# Patient Record
Sex: Male | Born: 1960
Health system: Southern US, Community
[De-identification: ages and names within clinical notes are randomized; demographics above are authoritative.]

---

## 2021-07-15 ENCOUNTER — Other Ambulatory Visit (HOSPITAL_COMMUNITY): Payer: Self-pay | Admitting: *Deleted

## 2021-07-15 DIAGNOSIS — I4729 Other ventricular tachycardia: Secondary | ICD-10-CM

## 2021-07-20 ENCOUNTER — Other Ambulatory Visit (HOSPITAL_COMMUNITY): Payer: Self-pay | Admitting: Cardiology

## 2021-07-20 DIAGNOSIS — I4729 Other ventricular tachycardia: Secondary | ICD-10-CM

## 2021-08-27 ENCOUNTER — Telehealth (HOSPITAL_COMMUNITY): Payer: Self-pay | Admitting: *Deleted

## 2021-08-27 NOTE — Telephone Encounter (Signed)
Attempted to call patient regarding upcoming cardiac MRI appointment. Left message on voicemail with name and callback number  Ginevra Tacker RN Navigator Cardiac Imaging [facility] [phone] Office [phone] Cell  

## 2021-08-30 ENCOUNTER — Telehealth (HOSPITAL_COMMUNITY): Payer: Self-pay | Admitting: *Deleted

## 2021-08-30 NOTE — Telephone Encounter (Signed)
Reaching out to patient to offer assistance regarding upcoming cardiac imaging study; pt verbalizes understanding of appt date/time, parking situation and where to check in, and verified current allergies; name and call back number provided for further questions should they arise  Celica Kotowski RN Navigator Cardiac Imaging [facility] [phone] office [phone] cell  Patient denies claustrophobia or metal. 

## 2021-08-31 ENCOUNTER — Ambulatory Visit (HOSPITAL_COMMUNITY)
Admission: RE | Admit: 2021-08-31 | Discharge: 2021-08-31 | Disposition: A | Discharge status: Home / Self Care | Payer: BC Managed Care – PPO | Source: Ambulatory Visit | Attending: Cardiology | Admitting: Cardiology

## 2021-08-31 ENCOUNTER — Other Ambulatory Visit: Payer: Self-pay

## 2021-08-31 DIAGNOSIS — I4729 Other ventricular tachycardia: Secondary | ICD-10-CM | POA: Insufficient documentation

## 2021-08-31 MED ORDER — GADOBUTROL 1 MMOL/ML IV SOLN
10.0000 mL | Freq: Once | INTRAVENOUS | Status: AC | PRN
  Administered 2021-08-31: 10 mL via INTRAVENOUS

## 2023-02-04 IMAGING — MR MR CARD MORPHOLOGY WO/W CM
45 of 48 series · 45 of 48 positions shown · IV contrast (Contrast agent)
Comparison: none

CLINICAL DATA: 60M with HTN p/w syncope. Echo with normal
biventricular function, no significant valvular disease. Coronary
CTA with nonobstructive CAD.

EXAM:
CARDIAC MRI
TECHNIQUE: The patient was scanned on a 1.5 Tesla Siemens magnet. A dedicated
cardiac coil was used. Functional imaging was done using [employer]iest[employer]
sequences. [DATE], and 4 chamber views were done to assess for RWMA's.
Modified Sienna rule using a short axis stack was used to
calculate an ejection fraction on a dedicated work station using
[employer] software. The patient received 10 cc of Gadavist. After 10
minutes inversion recovery sequences were used to assess for
infiltration and scar tissue.
CONTRAST:  10 cc  of Gadavist

[Series 4: [id number] · axial · 8.0mm · 1.48mm/px · 1 of 18 slices shown]
[im 1/18]
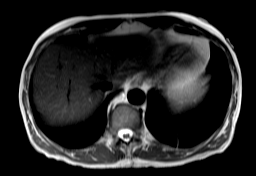

[Series 8: bSSFP · oblique · 8.0mm · 1.61mm/px · 1 of 25 slices shown (1 of 22)]
[im 1/25]
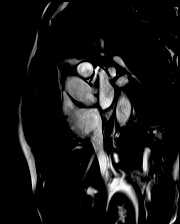

[Series 9: bSSFP · oblique · 8.0mm · 1.61mm/px · 1 of 25 slices shown (2 of 22)]
[im 1/25]
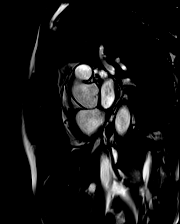

[Series 10: bSSFP · oblique · 8.0mm · 1.61mm/px · 1 of 25 slices shown (3 of 22)]
[im 1/25]
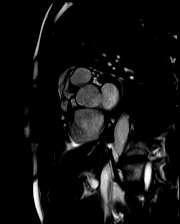

[Series 11: bSSFP · oblique · 8.0mm · 1.61mm/px · 1 of 25 slices shown (4 of 22)]
[im 1/25]
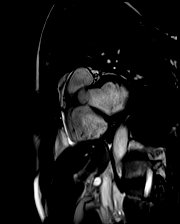

[Series 12: bSSFP · oblique · 8.0mm · 1.61mm/px · 1 of 25 slices shown (5 of 22)]
[im 1/25]
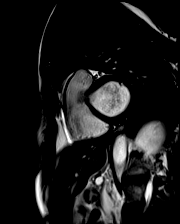

[Series 13: bSSFP · oblique · 8.0mm · 1.61mm/px · 1 of 25 slices shown (6 of 22)]
[im 1/25]
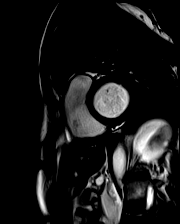

[Series 14: bSSFP · oblique · 8.0mm · 1.61mm/px · 1 of 25 slices shown (7 of 22)]
[im 1/25]
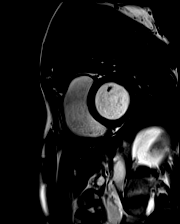

[Series 15: bSSFP · oblique · 8.0mm · 1.61mm/px · 1 of 25 slices shown (8 of 22)]
[im 1/25]
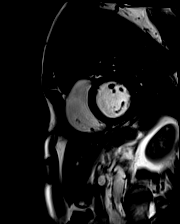

[Series 16: bSSFP · oblique · 8.0mm · 1.61mm/px · 1 of 25 slices shown (9 of 22)]
[im 1/25]
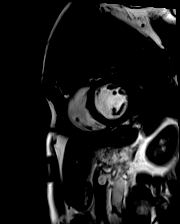

[Series 17: bSSFP · oblique · 8.0mm · 1.61mm/px · 1 of 25 slices shown (10 of 22)]
[im 1/25]
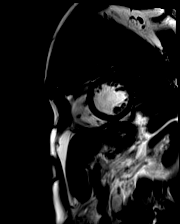

[Series 18: bSSFP · oblique · 8.0mm · 1.61mm/px · 1 of 25 slices shown (11 of 22)]
[im 1/25]
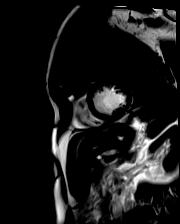

[Series 19: bSSFP · oblique · 8.0mm · 1.61mm/px · 1 of 25 slices shown (12 of 22)]
[im 1/25]
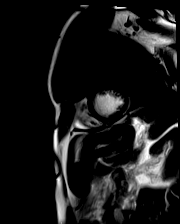

[Series 20: bSSFP · oblique · 8.0mm · 1.61mm/px · 1 of 25 slices shown (13 of 22)]
[im 1/25]
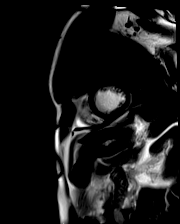

[Series 21: bSSFP · oblique · 8.0mm · 1.61mm/px · 1 of 25 slices shown (14 of 22)]
[im 1/25]
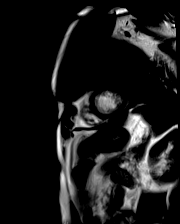

[Series 22: bSSFP · oblique · 8.0mm · 1.61mm/px · 1 of 25 slices shown (15 of 22)]
[im 1/25]
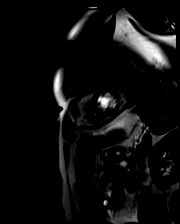

[Series 23: bSSFP · oblique · 8.0mm · 1.61mm/px · 1 of 25 slices shown (16 of 22)]
[im 1/25]
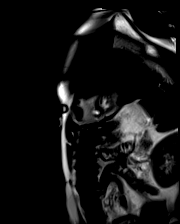

[Series 24: bSSFP · oblique · 8.0mm · 1.61mm/px · 1 of 25 slices shown (17 of 22)]
[im 1/25]
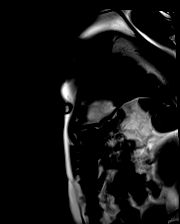

[Series 25: bSSFP · oblique · 8.0mm · 1.61mm/px · 1 of 25 slices shown (18 of 22)]
[im 1/25]
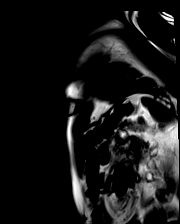

[Series 26: bSSFP · axial · 6.0mm · 1.41mm/px · 1 of 25 slices shown (19 of 22)]
[im 1/25]
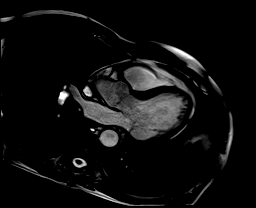

[Series 27: bSSFP · coronal · 6.0mm · 1.41mm/px · 1 of 25 slices shown (20 of 22)]
[im 1/25]
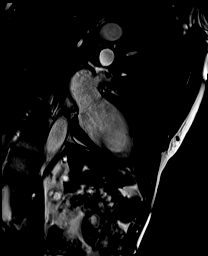

[Series 28: bSSFP · axial · 6.0mm · 1.41mm/px · 1 of 25 slices shown (21 of 22)]
[im 1/25]
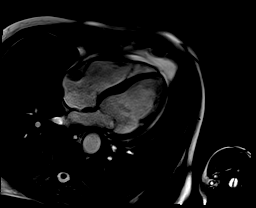

[Series 29: (id)_long_t1 · oblique · 8.0mm · 1.56mm/px · 1 of 24 slices shown]
[im 1/24]
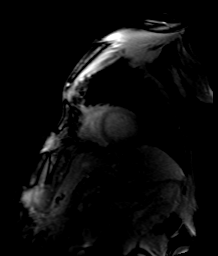

[Series 30: (id)_long_t1_moco · oblique · 8.0mm · 1.56mm/px · 1 of 24 slices shown]
[im 1/24]
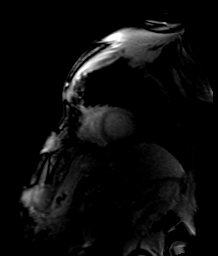

[Series 33: (id)_trufi · oblique · 8.0mm · 2.08mm/px · 1 of 9 slices shown]
[im 1/9]
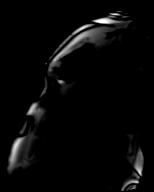

[Series 34: (id)_trufi_moco · oblique · 8.0mm · 2.08mm/px · 1 of 9 slices shown]
[im 1/9]
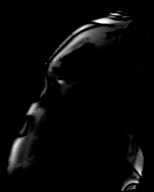

[Series 37: STIR · oblique · 8.0mm · 1.92mm/px · 1 of 17 slices shown]
[im 1/17]
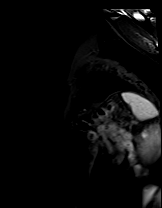

[Series 38: pre short axis · oblique · non-contrast · 8.0mm · 2.62mm/px · 1 of 10 slices shown (1 of 6)]
[im 1/10]
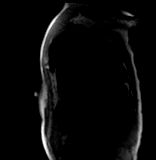

[Series 39: pre short axis · oblique · non-contrast · 8.0mm · 2.62mm/px · 1 of 10 slices shown (2 of 6)]
[im 1/10]
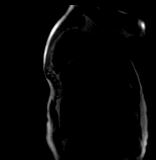

[Series 40: pre short axis · oblique · non-contrast · 8.0mm · 2.62mm/px · 1 of 10 slices shown (3 of 6)]
[im 1/10]
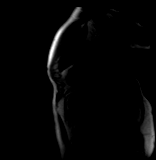

[Series 41: pre short axis · oblique · non-contrast · 8.0mm · 2.62mm/px · 1 of 10 slices shown (4 of 6)]
[im 1/10]
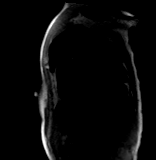

[Series 42: pre short axis · oblique · non-contrast · 8.0mm · 2.62mm/px · 1 of 10 slices shown (5 of 6)]
[im 1/10]
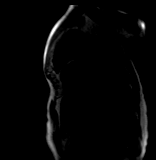

[Series 43: pre short axis · oblique · non-contrast · 8.0mm · 2.62mm/px · 1 of 10 slices shown (6 of 6)]
[im 1/10]
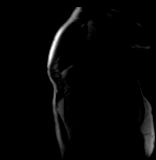

[Series 44: rest short axis · oblique · 8.0mm · 2.62mm/px · 1 of 60 slices shown (1 of 6)]
[im 1/60]
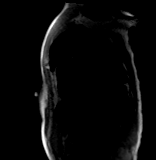

[Series 45: rest short axis · oblique · 8.0mm · 2.62mm/px · 1 of 60 slices shown (2 of 6)]
[im 1/60]
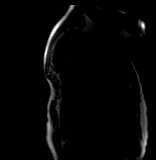

[Series 46: rest short axis · oblique · 8.0mm · 2.62mm/px · 1 of 60 slices shown (3 of 6)]
[im 1/60]
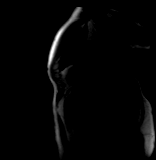

[Series 47: rest short axis · oblique · 8.0mm · 2.62mm/px · 1 of 60 slices shown (4 of 6)]
[im 1/60]
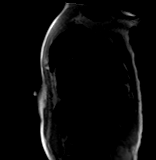

[Series 48: rest short axis · oblique · 8.0mm · 2.62mm/px · 1 of 60 slices shown (5 of 6)]
[im 1/60]
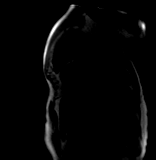

[Series 49: rest short axis · oblique · 8.0mm · 2.62mm/px · 1 of 60 slices shown (6 of 6)]
[im 1/60]
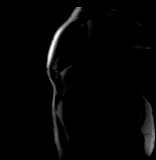

[Series 50: bSSFP · coronal · 6.0mm · 1.41mm/px · 1 of 25 slices shown (22 of 22)]
[im 1/25]
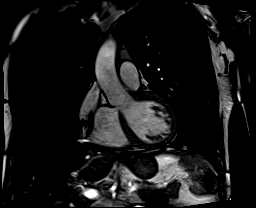

[Series 51: aortic valve cine · oblique · 6.0mm · 1.41mm/px · 1 of 25 slices shown]
[im 1/25]
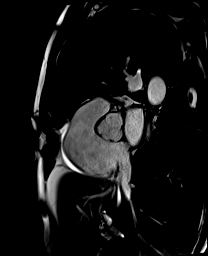

[Series 52: cine rvit · oblique · 6.0mm · 1.41mm/px · 1 of 25 slices shown]
[im 1/25]
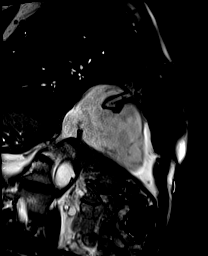

[Series 53: cine rvot · sagittal · 6.0mm · 1.41mm/px · 1 of 25 slices shown]
[im 1/25]
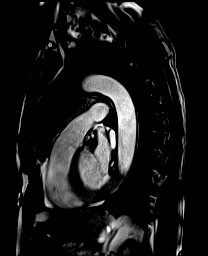

[Series 55: lge_single shot sa · oblique · 8.0mm · 2.08mm/px · 1 of 18 slices shown (1 of 2)]
[im 1/18]
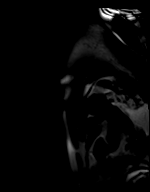

[Series 56: lge_single shot sa · oblique · 8.0mm · 2.08mm/px · 1 of 18 slices shown (2 of 2)]
[im 1/18]
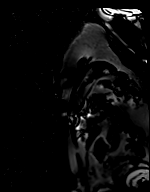

[45 of 48 positions shown; findings below may reference images not displayed]

FINDINGS: Left ventricle:

-Normal wall thickness

-Mild dilatation

-Normal systolic function

-Mild ECV elevation (30%)

-Normal T2 values

-No LGE

LV EF: 58% (Normal 56-78%)

Absolute volumes:

LV EDV: 203mL (Normal 77-195 mL)

LV ESV: 86mL (Normal 19-72 mL)

LV SV: 117mL (Normal 51-133 mL)

CO: 8.4L/min (Normal 2.8-8.8 L/min)

Indexed volumes:

LV EDV: 97mL/sq-m (Normal 47-92 mL/sq-m)

LV ESV: 41mL/sq-m (Normal 13-30 mL/sq-m)

LV SV: 56mL/sq-m (Normal 32-62 mL/sq-m)

CI: 4.0L/min/sq-m (Normal 1.7-4.2 L/min/sq-m)

Right ventricle: Normal size and systolic function

RV EF:  55% (Normal 47-74%)

Absolute volumes:

RV EDV: 218mL (Normal 88-227 mL)

RV ESV: 97mL (Normal 23-103 mL)

RV SV: 121mL (Normal 52-138 mL)

CO: 8.7L/min (Normal 2.8-8.8 L/min)

Indexed volumes:

RV EDV: 103mL/sq-m (Normal 55-105 mL/sq-m)

RV ESV: 46mL/sq-m (Normal 15-43 mL/sq-m)

RV SV: 57mL/sq-m (Normal 32-64 mL/sq-m)

CI: 4.1L/min/sq-m (Normal 1.7-4.2 L/min/sq-m)

Left atrium: Mild enlargement

Right atrium: Mild enlargement

Mitral valve: No regurgitation

Aortic valve: Tricuspid.  No regurgitation

Tricuspid valve: Trivial regurgitation

Pulmonic valve: No regurgitation

Aorta: Normal proximal ascending aorta

Pericardium: Normal
IMPRESSION: 1. Mild LV dilatation, normal wall thickness, and normal systolic
function (EF 58%)

2.  Normal RV size and systolic function (EF 55%)

3.  No late gadolinium enhancement to suggest myocardial scar
